# Patient Record
Sex: Female | Born: 2000 | Race: White | Hispanic: No | Marital: Single | Smoking: Never smoker
Health system: Southern US, Community
[De-identification: ages and names within clinical notes are randomized; demographics above are authoritative.]

## PROBLEM LIST (undated history)

## (undated) DIAGNOSIS — J45909 Unspecified asthma, uncomplicated: Secondary | ICD-10-CM

## (undated) HISTORY — PX: TYMPANOSTOMY TUBE PLACEMENT: SHX32

---
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
## 2023-03-03 ENCOUNTER — Emergency Department: Payer: BC Managed Care – PPO

## 2023-03-03 ENCOUNTER — Emergency Department
Admission: EM | Admit: 2023-03-03 | Discharge: 2023-03-03 | Disposition: A | Discharge status: Home / Self Care | Payer: BC Managed Care – PPO | Attending: Pediatrics | Admitting: Pediatrics

## 2023-03-03 DIAGNOSIS — R1084 Generalized abdominal pain: Secondary | ICD-10-CM | POA: Insufficient documentation

## 2023-03-03 LAB — URINALYSIS WITH MICROSCOPIC EXAM
Urine Bilirubin: NEGATIVE
Urine Blood: NEGATIVE
Urine Glucose: NEGATIVE
Urine Ketones: NEGATIVE mg/dL
Urine Leukocyte Esterase: NEGATIVE
Urine Nitrite: NEGATIVE
Urine Specific Gravity: 1.032 (ref 1.001–1.035)
Urine Urobilinogen: NORMAL mg/dL (ref 0.2–2.0)
Urine pH: 6 (ref 5.0–8.0)

## 2023-03-03 LAB — URINE HCG QUALITATIVE: Urine HCG Qualitative: NEGATIVE

## 2023-03-03 MED ORDER — ALUM & MAG HYDROXIDE-SIMETH 200-200-20 MG/5ML PO SUSP
30.0000 mL | Freq: Once | ORAL | Status: AC
Start: 2023-03-03 — End: 2023-03-03
  Administered 2023-03-03: 30 mL via ORAL
  Filled 2023-03-03: qty 30

## 2023-03-03 NOTE — Discharge Instructions (Signed)
 Your evaluated in the emergency department today for abdominal pain and bloating.  Your physical exam here is normal.  Your urinalysis is negative for infection.  Your abdominal x-ray is consistent with constipation.  No emergency interventions are indicated at this time.  Please continue to drink plenty of fluids, you may take Dulcolax for pills today and for again tomorrow to help move stools along.  You may take Maalox as needed for epigastric pain.  Please follow-up with your primary care provider on Monday for reassessment and return to the emergency department with persistent vomiting, or any other worsening symptoms or concerns.

## 2023-03-03 NOTE — ED Triage Notes (Signed)
 PT presents to ED with complaint of acute onset of bloating/epigastric pain/chills/headache since yesterday and worsening bloating today. PT denies vomiting diarrhea fever or sick contacts. Tolerated a ducalax @ 1530 today w/o relief. PT awake alert and oriented. LMP last week of December

## 2023-03-03 NOTE — ED Provider Notes (Signed)
 [facility] Provider History and Physical    Date Time:  03/03/23 4:29 PM  Patient Name:  [name], [name]  Department:  [facility]  Encounter Date:  03/03/2023  Attending:  Sherrilyn Cornet, NP      History of Presenting Illness:     Back Pain    23 y.o. female presents with history of feeling bloated starting yesterday.  Yesterday patient reports she had chills although no documented fever also had nausea for which she took Zofran .  No nausea or vomiting today, no diarrhea.  Had a small bowel movement today, patient reports soft bowel movement yesterday.  Continues to feel bloated and bloating makes her short of breath at times.  Last menstrual period was 2 weeks ago.  Additional historian needed due to patients age or cognitive capacity:  Yes  Additional Historian, Reason, Information (optional): Additional caregiver, mother, provided the following information history.            Past Medical History:     Past Medical History:   Diagnosis Date    Asthma without status asthmaticus      She has a past surgical history that includes Tympanostomy tube placement.  Immunizations:  Current  PMD:  Pcp, None, MD    Past Surgical History:   Past Surgical History[1]    Family History:   Family History[2]    Social History:     Pediatric History   Patient Parents    Bentler,Tammy (Mother)    Paugh,Bahman B (Father)     Other Topics Concern    Not on file   Social History Narrative    Not on file     Social History     Tobacco Use    Smoking status: Never    Smokeless tobacco: Never   Substance Use Topics    Alcohol use: Never       Allergies:   She has No Known Allergies.    Home Medications:     Home Medications       Med List Status: In Progress Set By: Georgette Sharper, RN at 03/03/2023  4:25 PM              levalbuterol (XOPENEX HFA) 45 MCG/ACT inhaler     Inhale 1-2 puffs into the lungs every 4 (four) hours as needed.     loratadine (CLARITIN) 10 MG tablet     Take 10 mg by mouth daily.             Review of Systems:   Review of Systems   Musculoskeletal:  Positive for back pain.         Physical Exam:   Pulse 85  BP 127/79  Resp 18  SpO2 100 %  Temp 97.8 F (36.6 C)  Wt 66.1 kg  Physical Exam  Vitals and nursing note reviewed.   Constitutional:       Appearance: Normal appearance. She is well-developed.   HENT:      Head: Normocephalic and atraumatic.      Right Ear: External ear normal.      Left Ear: External ear normal.      Nose: Nose normal.   Eyes:      Conjunctiva/sclera: Conjunctivae normal.      Pupils: Pupils are equal, round, and reactive to light.   Cardiovascular:      Rate and Rhythm: Normal rate and regular rhythm.      Heart sounds: Normal heart sounds.  Pulmonary:      Effort: Pulmonary effort is normal. No respiratory distress.      Breath sounds: Normal breath sounds. No wheezing or rales.   Chest:      Chest wall: No tenderness.   Abdominal:      General: Abdomen is flat. Bowel sounds are normal.      Palpations: Abdomen is soft.      Tenderness: There is abdominal tenderness.      Comments: left mid abdominal tenderness, no guarding or distention, soft abdomen   Musculoskeletal:      Cervical back: Normal range of motion and neck supple.   Skin:     General: Skin is warm and dry.      Capillary Refill: Capillary refill takes less than 2 seconds.   Neurological:      General: No focal deficit present.      Mental Status: She is alert and oriented to person, place, and time.         Labs:     Results       Procedure Component Value Units Date/Time    Urinalysis with Microscopic Exam [[id number]]  (Abnormal) Collected: 03/03/23 1653    Specimen: Urine, Clean Catch Updated: 03/03/23 1715     Urine Color Yellow     Urine Clarity Clear     Urine Specific Gravity 1.032     Urine pH 6.0     Urine Leukocyte Esterase Negative     Urine Nitrite Negative     Urine Protein 20= Trace     Urine Glucose Negative     Urine Ketones Negative mg/dL      Urine Urobilinogen Normal mg/dL      Urine  Bilirubin Negative     Urine Blood Negative     RBC, UA 0-2 /hpf      Urine WBC 0-5 /hpf      Urine Squamous Epithelial Cells 0-5 /hpf      Urine Mucus Present    Urine HCG Qualitative [[id number]]  (Normal) Collected: 03/03/23 1653    Specimen: Urine, Clean Catch Updated: 03/03/23 1708     Urine HCG Qualitative Negative    Culture, Urine [[id number]] Collected: 03/03/23 1653    Specimen: Urine, Clean Catch Updated: 03/03/23 1702            Rads:     Radiology Results (24 Hour)       Procedure Component Value Units Date/Time    Abdomen AP [[id number]] Collected: 03/03/23 1725    Order Status: Completed Updated: 03/03/23 1728    Narrative:      HISTORY: Abdominal pain, bloating.     COMPARISON: None    FINDINGS:   Gas and stool is noted within the patient's colon without evidence of  colonic distention. There is no evidence of bowel obstruction. No abnormal  abdominal calcifications are identified.      Impression:       Nonspecific gas and stool in the colon. No definite acute  pathology.    Toribio Hering, MD  03/03/2023 5:26 PM            MDM and ED COURSE:   I, Sherrilyn Cornet, NP, have been the primary provider for this patient during this ER visit.    PRIMARY PROBLEM LIST     Problem:  abdominal pain     DDX: constipation/gallstones/GERD  Plan:  Maalox/xray    SUMMARY OF CARE        Patient reports her  pain has improved after the Maalox.  Reviewed KUB, likely moderate constipation.  Recommended Dulcolax, patient with soft abdomen, no distention.  Discussed supportive care for symptoms, will discharge with follow instructions.    Your evaluated in the emergency department today for abdominal pain and bloating.  Your physical exam here is normal.  Your urinalysis is negative for infection.  Your abdominal x-ray is consistent with constipation.  No emergency interventions are indicated at this time.  Please continue to drink plenty of fluids, you may take Dulcolax for pills today and for again tomorrow to help  move stools along.  You may take Maalox as needed for epigastric pain.  Please follow-up with your primary care provider on Monday for reassessment and return to the emergency department with persistent vomiting, or any other worsening symptoms or concerns.       ED Medication Orders (From admission, onward)      Start Ordered     Status Ordering Provider    03/03/23 1642 03/03/23 1641  alum & mag hydroxide-simethicone  (MAALOX PLUS) 200-200-20 mg/5 mL suspension 30 mL  Once        Route: Oral  Ordered Dose: 30 mL       Last MAR action: Given KARELLAS-BARDIS, Emmons Toth          Procedures      CARDIAC and IMAGING INTERPRETATIONS    The following cardiac studies were independently interpreted by me the Emergency Medicine Provider.  For full cardiac study results please see chart.        The following imaging studies were independently interpreted by me (emergency medicine provider):  Abdominal Xray Interpreted by me (ED provider) SIDE : N/A  RESULT: AXR: Constipation  IMPRESSION: Constipation                  ADDITIONAL COMMUNICATIONS/CONSIDERATIONS                          Discharge Vitals:  Visit Vitals  BP 127/79   Pulse 85   Temp 97.8 F (36.6 C) (Tympanic)   Resp 18   Wt 66.1 kg   LMP 02/18/2023 (Approximate)   SpO2 100%      Vital Signs: Reviewed the patient's vital signs.   Nursing Notes: Reviewed and utilized available nursing notes.  Medical Records Reviewed: Reviewed available past medical records.  Counseling: The emergency provider has spoken with the patient and/or parent and discussed today's findings, in addition to providing specific details for the plan of care.  Questions are answered and there is agreement with the plan.      Disposition:     Clinical Impression  Final diagnoses:   Generalized abdominal pain       ER Disposition  ED Disposition       ED Disposition   Discharge    Condition   --    Date/Time   Thu Mar 03, 2023  5:38 PM    Comment   NIESHIA LARMON discharge to home/self  care.    Condition at disposition: Stable                 Follow-up Providers  [facility]  [address]  [place]  [address]  [phone]    If symptoms worsen      Prescriptions  New Prescriptions    No medications on file        Signed by: Sherrilyn Cornet, NP             [  1]   Past Surgical History:  Procedure Laterality Date    TYMPANOSTOMY TUBE PLACEMENT     [2] No family history on file.       [name] Fees, NP  03/03/23 1750       [name] Chiquita BRAVO, MD  03/04/23 [phone]

## 2023-03-05 LAB — CULTURE, URINE: Culture Urine: NO GROWTH

## 2023-08-03 ENCOUNTER — Other Ambulatory Visit (FREE_STANDING_LABORATORY_FACILITY): Payer: Self-pay | Admitting: Student in an Organized Health Care Education/Training Program

## 2023-08-03 LAB — HEPATITIS B SURFACE (HBV) ANTIBODY, QUANTITATIVE: Hepatitis B Surface Antibody: 59.78 m[IU]/mL

## 2023-08-05 LAB — QUANTIFERON(R) - TB GOLD PLUS
Mitogen-NIL: 9.22 [IU]/mL
NIL: 0.04 [IU]/mL
Quantiferon TB Gold Plus: NEGATIVE
TB1-NIL: 0 [IU]/mL
TB2-NIL: 0 [IU]/mL

## 2023-10-17 ENCOUNTER — Ambulatory Visit (INDEPENDENT_AMBULATORY_CARE_PROVIDER_SITE_OTHER)

## 2023-10-17 ENCOUNTER — Ambulatory Visit (INDEPENDENT_AMBULATORY_CARE_PROVIDER_SITE_OTHER): Admitting: Orthopaedic Surgery of the Spine

## 2023-10-17 ENCOUNTER — Encounter (INDEPENDENT_AMBULATORY_CARE_PROVIDER_SITE_OTHER): Payer: Self-pay | Admitting: Orthopaedic Surgery of the Spine

## 2023-10-17 VITALS — BP 125/80 | HR 67 | Ht 64.0 in | Wt 150.0 lb

## 2023-10-17 DIAGNOSIS — M5416 Radiculopathy, lumbar region: Secondary | ICD-10-CM

## 2023-10-17 DIAGNOSIS — M5412 Radiculopathy, cervical region: Secondary | ICD-10-CM

## 2023-10-17 DIAGNOSIS — M4000 Postural kyphosis, site unspecified: Secondary | ICD-10-CM

## 2023-10-17 DIAGNOSIS — M47812 Spondylosis without myelopathy or radiculopathy, cervical region: Secondary | ICD-10-CM

## 2023-10-17 DIAGNOSIS — M545 Low back pain, unspecified: Secondary | ICD-10-CM

## 2023-10-17 DIAGNOSIS — Y9366 Activity, soccer: Secondary | ICD-10-CM

## 2023-10-17 MED ORDER — IBUPROFEN 200 MG PO TABS
800.0000 mg | ORAL_TABLET | Freq: Three times a day (TID) | ORAL | 2 refills | Status: AC | PRN
Start: 2023-10-17 — End: ?

## 2023-10-17 NOTE — Progress Notes (Signed)
 [facility]         Subjective     History of Present Illness  [name] [name] is a 23 year old female who presents with chronic neck pain and tingling.    She has a long history of neck issues primarily due to sports-related injuries from playing soccer, with chronic muscular pain and episodes of whiplash. About a year ago, after a long flight, she experienced severe neck pain and muscle tightness from her shoulders upwards, which was relieved with muscle relaxers. No further imaging or follow-up was pursued at that time.    Two months ago, after a long car ride, she experienced a 'snap' in her neck while washing her face, followed by tingling and an inability to move her head without pain. Muscle relaxers were used to manage the symptoms. She describes a constant feeling of needing to protect her neck, fearing another episode.    Approximately two years ago, she experienced a sudden onset of tingling and numbness from the base of her neck through her skull to her forehead after cracking her neck at an awkward angle. This episode was brief, lasting about a minute, but was intensely painful.    Currently, she experiences tingling in her left shoulder near the spine and reports general tension in her shoulders. She is right-hand dominant. Intermittent lower back pain is also noted, particularly during soccer, with a sensation of potential instability. Hyperextending her neck causes pain at the base of her skull.    She is currently using muscle relaxers as needed. She has never had an MRI, but has had x-rays of her neck in the past.    No electrical pain shooting into the arms.    Review of Systems    Objective   BP 125/80   Pulse 67   Ht 1.626 m (5' 4)   Wt 68 kg (150 lb)   LMP  (LMP Unknown)   SpO2 98%   BMI 25.75 kg/m   Physical Exam  Physical Exam  NECK: Positive Spurling's maneuver at the base of the skull. Negative Lhermitte's sign.  NEUROLOGICAL: Reflexes  normal except for slightly decreased reflexes on the left side. Sensation intact bilaterally.   tender more on left side  Results            Assessment/Plan   .  1. Cervical radiculitis  MRI Cervical Spine WO Contrast    Ambulatory referral to Physical Therapy      2. Lumbar back pain  XR Lumbar Spine 4+ Views    Ambulatory referral to Physical Therapy      3. Activity, soccer  MRI Cervical Spine WO Contrast      4. Lumbar radiculitis  Ambulatory referral to Physical Therapy      5. Cervical spondylosis  MRI Cervical Spine WO Contrast    Ambulatory referral to Physical Therapy      6. Postural kyphosis, unspecified spinal region  MRI Cervical Spine WO Contrast    Ambulatory referral to Physical Therapy          Assessment & Plan  Cervical radiculitis with left-sided symptoms    Chronic cervical radiculitis presents with left-sided tingling and numbness in the shoulder and scapular area, worsened by certain movements and positions. Positive Spurling's maneuver indicates nerve root irritation, with decreased reflexes on the left side but no electrical shooting pain into the arms. She has a history of neck injuries from sports. Order an MRI  of the cervical spine to evaluate potential disc bulge, focusing on the left side and inner scapular area. Initiate physical therapy with dry needling for the left shoulder and inner scapular area. Prescribe ibuprofen  for inflammation and pain management. Continue muscle relaxers as needed.    Cervical spondylosis and kyphosis    Cervical spondylosis and kyphosis cause neck pain and stiffness, influenced by neck injuries and poor posture. Straightening of the neck raises concern for disc tension, potentially leading to complications. Kyphosis may be posture-related or due to trauma. Order physical therapy to address posture and strengthen neck muscles.    Low back pain    Intermittent low back pain occurs with occasional instability during activities like soccer. There is no  numbness or tingling in the lower extremities. Good alignment and posture in the lower back are noted, but pain upon hyperextension suggests possible muscular or postural issues. Order physical therapy focusing on core strengthening and stabilization exercises.    Verbal consent obtained to record this visit.

## 2023-11-02 ENCOUNTER — Ambulatory Visit
Admission: RE | Admit: 2023-11-02 | Discharge: 2023-11-02 | Discharge status: Home / Self Care | Source: Ambulatory Visit | Attending: Orthopaedic Surgery of the Spine

## 2023-11-02 DIAGNOSIS — M47812 Spondylosis without myelopathy or radiculopathy, cervical region: Secondary | ICD-10-CM | POA: Insufficient documentation

## 2023-11-02 DIAGNOSIS — Y9366 Activity, soccer: Secondary | ICD-10-CM | POA: Insufficient documentation

## 2023-11-02 DIAGNOSIS — M4000 Postural kyphosis, site unspecified: Secondary | ICD-10-CM | POA: Insufficient documentation

## 2023-11-02 DIAGNOSIS — M5412 Radiculopathy, cervical region: Secondary | ICD-10-CM | POA: Insufficient documentation

## 2023-11-03 ENCOUNTER — Ambulatory Visit (INDEPENDENT_AMBULATORY_CARE_PROVIDER_SITE_OTHER): Payer: Self-pay | Admitting: Orthopaedic Surgery of the Spine

## 2023-12-20 ENCOUNTER — Inpatient Hospital Stay: Admitting: Student in an Organized Health Care Education/Training Program

## 2023-12-27 ENCOUNTER — Inpatient Hospital Stay: Admitting: Student in an Organized Health Care Education/Training Program

## 2024-01-03 ENCOUNTER — Inpatient Hospital Stay: Admitting: Student in an Organized Health Care Education/Training Program

## 2024-01-05 ENCOUNTER — Inpatient Hospital Stay: Admitting: Student in an Organized Health Care Education/Training Program

## 2024-01-09 ENCOUNTER — Inpatient Hospital Stay: Admitting: Rehabilitative and Restorative Service Providers"

## 2024-01-12 ENCOUNTER — Inpatient Hospital Stay: Admitting: Student in an Organized Health Care Education/Training Program

## 2024-01-16 ENCOUNTER — Inpatient Hospital Stay: Admitting: Rehabilitative and Restorative Service Providers"

## 2024-01-24 ENCOUNTER — Inpatient Hospital Stay: Admitting: Student in an Organized Health Care Education/Training Program

## 2024-02-03 NOTE — Progress Notes (Unsigned)
 [facility]   [place]   [phone]  PROGRESS NOTE    Date Time: 02/06/2024  11:35 AM  Patient Name: [name] [name]  Referring Provider: n/a    Chief Complaint     Chief Complaint   Patient presents with    Back Pain       HPI:   [name] [name] is a 23 y.o. female who has a past medical history of  has a past medical history of Asthma without status asthmaticus., presents to the office for evaluation of chronic neck and low back pain.     Interval Hx 02/06/24:  - here to discuss worsening low back pain with new onset left radiculopathy  - participating in PT consistently - recently underwent dry needling in the lumbar spine and she noticed worsening of left radiculopathy  - meds: none  - cervical neck pain is consistent with intermittent cervicogenic headaches - responding well in PT - she has been avoiding NSAIDs to prevent rebound HA  - paternal hx of lumbar degeneration  - significant physical history including soccer participation and concussions in the past      PRIOR NOTE WITH DR. SIDDIQUI 10/17/23:  She has a long history of neck issues primarily due to sports-related injuries from playing soccer, with chronic muscular pain and episodes of whiplash. About a year ago, after a long flight, she experienced severe neck pain and muscle tightness from her shoulders upwards, which was relieved with muscle relaxers. No further imaging or follow-up was pursued at that time.    Two months ago, after a long car ride, she experienced a 'snap' in her neck while washing her face, followed by tingling and an inability to move her head without pain. Muscle relaxers were used to manage the symptoms. She describes a constant feeling of needing to protect her neck, fearing another episode.    Approximately two years ago, she experienced a sudden onset of tingling and numbness from the base of her neck through her skull to her forehead after cracking her neck at an awkward angle. This episode was brief,  lasting about a minute, but was intensely painful.    Currently, she experiences tingling in her left shoulder near the spine and reports general tension in her shoulders. She is right-hand dominant. Intermittent lower back pain is also noted, particularly during soccer, with a sensation of potential instability. Hyperextending her neck causes pain at the base of her skull.    She is currently using muscle relaxers as needed. She has never had an MRI, but has had x-rays of her neck in the past.    No electrical pain shooting into the arms.     Prior treatments include:    [x]  Non-steroidal anti-inflammatory medications  [x]  Tylenol   [x]  Physical therapy  []  Chiropractor  []  Epidural steroid injections  []  Bracing  []  Steroids  []  Muscle Relaxer  []  Gabapentin  []  Ice  []  Heat  [x]  Other: dry needling    Patient denies any fever, dysuria, incontinence, or other bowel/bladder dysfunction.  Patient denies myelopathic symptoms    Patient denies any history of prior spinal surgery.        There is no height or weight on file to calculate BMI.    PMH:   Medical History[1]    PSH:    has a past surgical history that includes Tympanostomy tube placement.    Allergies:   Allergies[2]    Medications:   Current Medications[3]  Social Hx:     Social History     Occupational History    Not on file   Tobacco Use    Smoking status: Never    Smokeless tobacco: Never   Vaping Use    Vaping status: Never Used   Substance and Sexual Activity    Alcohol use: Never    Drug use: Never    Sexual activity: Not on file       Family Hx:   Family History[4]   If not otherwise specified above means noncontributory.     ROS   General: negative for fever, chills, lethargy  Ophthalmic: negative for decreased, blurry or double vision  ENT: negative for epistaxis, headaches, nasal discharge  Respiratory: negative for cough, pleuritic pain, shortness of breath,   Cardiovascular: negative for chest pain, edema, loss of consciousness,  palpitations,  Gastrointestinal: negative for abdominal pain, nausea, vomiting, change in oral intake  Genitourinary: negative for pelvic pain or dysuria, urinary incontinence   Musculoskeletal: Per HPI  Neurological: negative for numbness, tingling, weakness, or visual changes  Dermatological: negative for rash    Physical Exam   There were no vitals taken for this visit.    Appearance:  Well kept, normally developed  Psych:  Alert and oriented x3, normal mood and affect  Eyes:  Anicteric  Cardiovascular:  No lower extremity edema, extremities warm  Respiratory:  Breathing unlabored  Skin: no rashes   Musculoskeletal:   The patient also has good range of motion of the cervical spine, shoulders and elbows.  Gait: intact  The patient tolerates reasonable flexion and extension of the lumbar spine.   There were no dermatologic abnormalities over the back.   SLR: negative  TTP: left paraspinal, midline lumbosacral  There is good range of motion of the hips and knees bilaterally without pain. The calves are nontender and the pulses are symmetric. Feet are warm and well perfused.    Motor Exam LE:     Psoas GM Quads Hamst Abd Add TA EHL Gastroc   Right 5 5 5 5 5 5 5 5 5    Left 5 5 5 5 5 5 5 5 5      Deep Tendon Reflexes:  2/2 Left knee, 2/2 Right Knee, 2/2 Left ankle, 2/2 Right ankle    Neurologic: Sensation intact to light touch bilateral lower extremities. No clonus.    Review of Imaging Studies     No results found.      MRI Cervical 10/17/23 impression: minimal disc bulge of C5-C6 without foraminal stenosis or nerve impingement    Xrays Lumbar 10/17/23 impression: no acute abnormalities or fractures     Assessment:   [name] [name] is a 23 y.o. female    [name] was seen today for back pain.    Diagnoses and all orders for this visit:    Lumbar back pain  -     MRI Lumbar Spine WO Contrast; Future         Plan:      Diagnosis ICD-10-CM Associated Order   1. Lumbar back pain  M54.50 MRI Lumbar Spine WO Contrast           Discussion had regarding pain, symptoms and new onset left radiculopathy in the L5 distribution after dry needling and PT participation  Encouraged to continue activity as tolerated  Encouraged stretches and exercises  Discussed benefit of rest, ice and heat for healing  Discussed medications, side effects and proper administration including:  -  NSAID - OTC NSAIDs such as ibuprofen  or aleve   - Muscle Relaxer - flexeril - instructed not to drive due to potential for drowsy side effect prn  Encouraged to continue PT participation as tolerated  Imaging:  - Reviewed with patient as above  - MRI Lumbar spine ordered and instructions provided to patient given worsening of low back pain with left radiculopathy new onset    - F/U after MRi      Thank you for allowing me to participate in your care.    Signed by: Schuyler JONELLE Oman, FNP,   Please feel free to call or send a mychart for any questions:  (O)  [phone]       Due to the complex nature of this patients problem, I spent a total of 45 minutes which was  preparing to see the patient on the day of the encounter by reviewing records and test results, including imaging review, counseling/educating the patient on maintaining a healthy weight, avoidance of smoking, pain management, ordering test including imaging studies, communications with other health professionals, coordinating patient care and documenting information in the EMR. This also included any face to face time with the patient and the time was minus any procedures that were performed during the encounter that are separately reportable.    ________________________________________________________________________________________________________________________________________________  The review of the patient's medications does not in any way constitute an endorsement, by this clinician,  of their use, dosage, indications, route, efficacy, interactions, or other clinical parameters.         [1]   Past  Medical History:  Diagnosis Date    Asthma without status asthmaticus    [2] No Known Allergies  [3]   Current Outpatient Medications   Medication Sig Dispense Refill    ibuprofen  (ADVIL ) 200 MG tablet Take 4 tablets (800 mg) by mouth every 8 (eight) hours as needed for Pain 90 tablet 2    levalbuterol (XOPENEX HFA) 45 MCG/ACT inhaler Inhale 1-2 puffs into the lungs every 4 (four) hours as needed.      loratadine (CLARITIN) 10 MG tablet Take 10 mg by mouth daily.       No current facility-administered medications for this visit.   [4]   Family History  Problem Relation Name Age of Onset    No known problems Mother      No known problems Father

## 2024-02-06 ENCOUNTER — Encounter (INDEPENDENT_AMBULATORY_CARE_PROVIDER_SITE_OTHER): Payer: Self-pay | Admitting: Family

## 2024-02-06 ENCOUNTER — Ambulatory Visit (INDEPENDENT_AMBULATORY_CARE_PROVIDER_SITE_OTHER): Admitting: Family

## 2024-02-06 DIAGNOSIS — M545 Low back pain, unspecified: Secondary | ICD-10-CM

## 2024-03-09 ENCOUNTER — Ambulatory Visit
